# Patient Record
Sex: Female | Born: 1960 | Race: White | Hispanic: No | Marital: Married
Health system: Southern US, Community
[De-identification: ages and names within clinical notes are randomized; demographics above are authoritative.]

---

## 2007-08-03 ENCOUNTER — Emergency Department (HOSPITAL_COMMUNITY): Admission: EM | Admit: 2007-08-03 | Discharge: 2007-08-03 | Payer: Self-pay | Admitting: Emergency Medicine

## 2007-08-03 ENCOUNTER — Inpatient Hospital Stay (HOSPITAL_COMMUNITY): Admission: RE | Admit: 2007-08-03 | Discharge: 2007-08-04 | Payer: Self-pay | Admitting: Psychiatry

## 2007-08-03 ENCOUNTER — Ambulatory Visit: Payer: Self-pay | Admitting: Psychiatry

## 2007-09-09 ENCOUNTER — Other Ambulatory Visit: Admission: RE | Admit: 2007-09-09 | Discharge: 2007-09-09 | Payer: Self-pay | Admitting: Family Medicine

## 2007-09-21 ENCOUNTER — Encounter: Admission: RE | Admit: 2007-09-21 | Discharge: 2007-09-21 | Payer: Self-pay | Admitting: Family Medicine

## 2007-09-23 ENCOUNTER — Ambulatory Visit (HOSPITAL_COMMUNITY): Admission: RE | Admit: 2007-09-23 | Discharge: 2007-09-24 | Payer: Self-pay | Admitting: Neurosurgery

## 2008-09-22 ENCOUNTER — Encounter: Admission: RE | Admit: 2008-09-22 | Discharge: 2008-09-22 | Payer: Self-pay | Admitting: Family Medicine

## 2009-02-07 ENCOUNTER — Emergency Department (HOSPITAL_COMMUNITY): Admission: EM | Admit: 2009-02-07 | Discharge: 2009-02-07 | Payer: Self-pay | Admitting: Emergency Medicine

## 2009-09-26 ENCOUNTER — Encounter: Admission: RE | Admit: 2009-09-26 | Discharge: 2009-09-26 | Payer: Self-pay | Admitting: Family Medicine

## 2010-08-30 LAB — BASIC METABOLIC PANEL
BUN: 7 mg/dL (ref 6–23)
CO2: 27 mEq/L (ref 19–32)
Chloride: 107 mEq/L (ref 96–112)
Potassium: 3.7 mEq/L (ref 3.5–5.1)

## 2010-08-30 LAB — CBC
MCV: 94.3 fL (ref 78.0–100.0)
RBC: 4.27 MIL/uL (ref 3.87–5.11)

## 2010-08-30 LAB — URINALYSIS, ROUTINE W REFLEX MICROSCOPIC
Bilirubin Urine: NEGATIVE
Hgb urine dipstick: NEGATIVE
Ketones, ur: NEGATIVE mg/dL
Nitrite: NEGATIVE
Protein, ur: NEGATIVE mg/dL
Specific Gravity, Urine: 1.005 (ref 1.005–1.030)
Urobilinogen, UA: 0.2 mg/dL (ref 0.0–1.0)

## 2010-08-30 LAB — DIFFERENTIAL
Eosinophils Absolute: 0 10*3/uL (ref 0.0–0.7)
Eosinophils Relative: 0 % (ref 0–5)
Lymphocytes Relative: 12 % (ref 12–46)
Lymphs Abs: 1.3 10*3/uL (ref 0.7–4.0)
Monocytes Absolute: 0.5 10*3/uL (ref 0.1–1.0)
Monocytes Relative: 5 % (ref 3–12)
Neutro Abs: 9 10*3/uL — ABNORMAL HIGH (ref 1.7–7.7)
Neutrophils Relative %: 83 % — ABNORMAL HIGH (ref 43–77)

## 2010-08-30 LAB — POCT PREGNANCY, URINE: Preg Test, Ur: NEGATIVE

## 2010-10-01 ENCOUNTER — Other Ambulatory Visit: Payer: Self-pay | Admitting: Family Medicine

## 2010-10-01 DIAGNOSIS — Z1231 Encounter for screening mammogram for malignant neoplasm of breast: Secondary | ICD-10-CM

## 2010-10-08 NOTE — Op Note (Signed)
NAMEMarland Kitchen  Gina, Fowler            ACCOUNT NO.:  [id number]   MEDICAL RECORD NO.:  [id number]          PATIENT TYPE:  OIB   LOCATION:  3523                         FACILITY:  [facility]   PHYSICIAN:  Danae Orleans. Venetia Maxon, M.D.  DATE OF BIRTH:  [birth year]   DATE OF PROCEDURE:  DATE OF DISCHARGE:                               OPERATIVE REPORT   PREOPERATIVE DIAGNOSES:  1. Cervical disk herniations, C4-C5, C5-C6 with spondylosis.  2. Degenerative disk disease.  3. Radiculopathy.   POSTOPERATIVE DIAGNOSES:  1. Cervical disk herniations, C4-C5, C5-C6 with spondylosis.  2. Degenerative disk disease.  3. Radiculopathy.   PROCEDURE:  Anterior cervical decompression and fusion, C4-C5 and C5-C6  with allograft, autograft, FortrOss, and plate.   SURGEON:  Danae Orleans. Venetia Maxon, MD   ASSISTANTS:  1. Georgiann Cocker, RN  2. Clydene Fake, MD   ANESTHESIA:  General endotracheal.   ESTIMATED BLOOD LOSS:  Minimal at 25 mL.   COMPLICATIONS:  None.   DISPOSITION:  Recovery.   INDICATIONS:  Gina Fowler is a 50 year old woman with significant  cervical spondylosis and disk herniation C4-C5 and C5-C6 on the right  with right arm pain and weakness.  It was elected to take her to surgery  for anterior cervical decompression and fusion at the affected levels.   PROCEDURE:  Gina Fowler was brought to the operating room.  Following  satisfactory and uncomplicated induction of general endotracheal  anesthesia and placement of intravenous lines, the patient was placed in  a supine position on the operating table.  Her neck was placed in slight  extension.  She was placed in 5 pounds of Halter traction.  Her anterior  neck was prepped and draped in the usual sterile fashion using left-  sided transverse skin incision from midline to the anterior border  sternocleidomastoid muscle that was made after infiltrating skin and  subcutaneous tissues and local lidocaine.  Incision was carried through  platysmal  layer.  Subplatysmal dissection was performed exposing the  anterior border sternocleidomastoid muscle using blunt dissection.  The  carotid sheath was kept lateral, trachea and esophagus kept medial  exposing the anterior cervical spine.  Bent spinal needles were placed,  it was felt to be the C4-C5, C5-C6 levels and this was confirmed by the  intraoperative x-ray.  Subsequently, the longus colli muscles were taken  down from the anterior cervical spine from C4-C6 bilaterally using  electrocautery and key elevator.  Self-retaining shadow line retractor  was placed along with up and down retractors.  The interspaces were  incised at each of these levels.  After confirmatory x-ray was obtained,  the endplates were eburnated with high-speed drill and bone autograft  was preserved for later use of bone grafting.  Under loupe magnification  and using the operating microscope, the uncinate spurs were removed and  posterior longitudinal ligament was incised and removed with 2 and 3-mm  gold tip Kerrison rongeurs.  Each of the interspaces were decompressed  and as were the neural foramina, there was a fragment of herniated disk  at C4-C5 on the right and this was removed with significant  decompression of the lateral recess and C5 nerve root.  After hemostasis  was assured with Gelfoam soaked in thrombin and trial sizing a 6-mm  allograft bone wedge was selected, fashioned with a high-speed drill  pack with morcellized bone autograft and FortrOss reconstituted with the  patient's own autogenous blood.  This was countersunk in the interspace  appropriately.  Attention was then turned to the C5-C6 level where the  similar decompression was performed.  The C6 nerve root was widely  decompressed.  Hemostasis and both the cervical spinal dura and both C6  nerve root was widely decompressed.  Hemostasis was again assured and  after trial sizing, a 6-mm bone graft was fashioned with a high-speed  drill  and packed with morcellized bone autograft and FortrOss inserted  in the interspace and countersunk appropriately.  A 32-mm anterior  cervical plate was affixed to the anterior cervical spine using the  Trestle system.  A 14-mm variable angle screws were placed at C4-C5-C6.  The traction weight was removed prior to placing the plate.  All screws  had excellent purchase.  Locking mechanisms were engaged.  Final x-ray  demonstrated well-positioned interbody grafts and anterior cervical  plate.  The hemostasis was assured.  The soft tissues were inspected and  found to be in good repair.  The platysmal layer was closed with 3-0  Vicryl sutures, and the skin edges were reapproximated with 3-0 Vicryl  subcuticular stitch.  The wound was dressed with Dermabond.  The patient  was extubated in the operating room and taken to recovery in stable  satisfactory condition.      Danae Orleans. Venetia Maxon, M.D.  Electronically Signed     JDS/MEDQ  D:  09/23/2007  T:  09/24/2007  Job:  [id number]

## 2010-10-08 NOTE — Discharge Summary (Signed)
NAMEMarland Fowler  CIJI, BOSTON NO.:  [id number]   MEDICAL RECORD NO.:  [id number]          PATIENT TYPE:  IPS   LOCATION:  0307                          [facility]ILITY[facility]  [facility]   PHYSICIAN:  Anselm Jungling, MD  DATE OF BIRTH:  1961-01-10   DATE OF ADMISSION:  08/03/2007  DATE OF DISCHARGE:  08/04/2007                               DISCHARGE SUMMARY   IDENTIFYING DATA AND REASON FOR ADMISSION:  The patient is a 50 year old  married white female who was admitted due to mood instability,  increasing depression.  Please refer to the admission note for further  details pertaining to the symptoms, circumstances and history that led  to her hospitalization.  She was given an initial Axis I diagnosis of  major depressive disorder, recurrent.   MEDICAL AND LABORATORY:  The patient was medically and physically  assessed by the psychiatric nurse practitioner.  She was in good health  without any acute medical problems.  There were no significant medical  issues.   HOSPITAL COURSE:  The patient was admitted to the adult inpatient  psychiatric service.  She presented as a well-nourished, well-developed  adult female who was alert, fully oriented, well-organized, and a good  historian.  There were no signs or symptoms of psychosis or thought  disorder.  Her mood appeared mildly depressed and her affect was  appropriate, with good range.  She reported in the initial interview  that her health insurance company's help line told her to report that  she was having suicidal ideation in order to secure admission to the  inpatient unit, ostensibly for the sake of being seen sooner that she  would have if she had made an appointment for an outpatient psychiatrist  and waited 3 weeks for this.   When I explained a at length to the patient and in detail the specific  indications and criteria for inpatient psychiatric admission, we both  agreed that she was not appropriate for the inpatient  service.   Her case manager contacted her husband, who accepted her explanation of  the circumstances.   There was significant evidence that the patient's mood had deteriorated  because of cessation of Cymbalta therapy.  We recommended a restarting  of Cymbalta and wrote a prescription for this.   AFTERCARE:  The patient was to follow up with Donnie Aho, nurse  practitioner, for psychiatric medication management at [facility].  She was to follow-up with her counselor, Shaaron Adler, also at [facility].   DISCHARGE MEDICATIONS:  Cymbalta 30 mg daily.   DISCHARGE DIAGNOSES:  AXIS I: Major depressive disorder, recurrent.  AXIS II:  Deferred.  AXIS III:  No acute or chronic illnesses.  AXIS IV:  Stressors severe.  AXIS V: GAF on discharge 60.      Anselm Jungling, MD  Electronically Signed     SPB/MEDQ  D:  08/26/2007  T:  08/27/2007  Job:  [id number]

## 2010-10-30 ENCOUNTER — Ambulatory Visit: Payer: Self-pay

## 2010-10-31 ENCOUNTER — Ambulatory Visit
Admission: RE | Admit: 2010-10-31 | Discharge: 2010-10-31 | Disposition: A | Discharge status: Home / Self Care | Payer: BC Managed Care – PPO | Source: Ambulatory Visit | Attending: Family Medicine | Admitting: Family Medicine

## 2010-10-31 DIAGNOSIS — Z1231 Encounter for screening mammogram for malignant neoplasm of breast: Secondary | ICD-10-CM

## 2010-11-05 ENCOUNTER — Other Ambulatory Visit: Payer: Self-pay | Admitting: Family Medicine

## 2010-11-05 DIAGNOSIS — R928 Other abnormal and inconclusive findings on diagnostic imaging of breast: Secondary | ICD-10-CM

## 2010-11-12 ENCOUNTER — Ambulatory Visit
Admission: RE | Admit: 2010-11-12 | Discharge: 2010-11-12 | Disposition: A | Discharge status: Home / Self Care | Payer: BC Managed Care – PPO | Source: Ambulatory Visit | Attending: Family Medicine | Admitting: Family Medicine

## 2010-11-12 DIAGNOSIS — R928 Other abnormal and inconclusive findings on diagnostic imaging of breast: Secondary | ICD-10-CM

## 2011-02-17 LAB — RAPID URINE DRUG SCREEN, HOSP PERFORMED
Amphetamines: NOT DETECTED
Cocaine: NOT DETECTED
Tetrahydrocannabinol: NOT DETECTED

## 2011-02-17 LAB — DIFFERENTIAL
Basophils Absolute: 0
Eosinophils Absolute: 0.1
Eosinophils Relative: 2
Lymphs Abs: 2.1
Monocytes Relative: 6

## 2011-02-17 LAB — ETHANOL: Alcohol, Ethyl (B): <5

## 2011-02-17 LAB — BASIC METABOLIC PANEL
GFR calc Af Amer: >60
Sodium: 141

## 2011-02-17 LAB — CBC
MCHC: 33.9
Platelets: 266

## 2011-02-18 LAB — CBC
HCT: 38.9
Hemoglobin: 13.1
MCHC: 33.8
MCV: 86.8
Platelets: 256
RBC: 4.48
RDW: 14.3
WBC: 8.7

## 2011-04-30 ENCOUNTER — Other Ambulatory Visit: Payer: Self-pay | Admitting: Family Medicine

## 2011-04-30 DIAGNOSIS — N63 Unspecified lump in unspecified breast: Secondary | ICD-10-CM

## 2011-05-14 ENCOUNTER — Ambulatory Visit
Admission: RE | Admit: 2011-05-14 | Discharge: 2011-05-14 | Disposition: A | Discharge status: Home / Self Care | Payer: BC Managed Care – PPO | Source: Ambulatory Visit | Attending: Family Medicine | Admitting: Family Medicine

## 2011-05-14 DIAGNOSIS — N63 Unspecified lump in unspecified breast: Secondary | ICD-10-CM

## 2011-11-19 ENCOUNTER — Other Ambulatory Visit: Payer: Self-pay | Admitting: Family Medicine

## 2011-11-19 DIAGNOSIS — N6009 Solitary cyst of unspecified breast: Secondary | ICD-10-CM

## 2011-11-28 ENCOUNTER — Ambulatory Visit
Admission: RE | Admit: 2011-11-28 | Discharge: 2011-11-28 | Disposition: A | Discharge status: Home / Self Care | Payer: BC Managed Care – PPO | Source: Ambulatory Visit | Attending: Family Medicine | Admitting: Family Medicine

## 2011-11-28 DIAGNOSIS — N6009 Solitary cyst of unspecified breast: Secondary | ICD-10-CM

## 2013-01-10 ENCOUNTER — Other Ambulatory Visit: Payer: Self-pay

## 2013-01-10 DIAGNOSIS — Z1231 Encounter for screening mammogram for malignant neoplasm of breast: Secondary | ICD-10-CM

## 2013-01-31 ENCOUNTER — Ambulatory Visit
Admission: RE | Admit: 2013-01-31 | Discharge: 2013-01-31 | Disposition: A | Discharge status: Home / Self Care | Payer: BC Managed Care – PPO | Source: Ambulatory Visit

## 2013-01-31 DIAGNOSIS — Z1231 Encounter for screening mammogram for malignant neoplasm of breast: Secondary | ICD-10-CM

## 2013-08-18 ENCOUNTER — Ambulatory Visit
Admission: RE | Admit: 2013-08-18 | Discharge: 2013-08-18 | Disposition: A | Discharge status: Home / Self Care | Payer: BC Managed Care – PPO | Source: Ambulatory Visit | Attending: Physician Assistant | Admitting: Physician Assistant

## 2013-08-18 ENCOUNTER — Other Ambulatory Visit: Payer: Self-pay | Admitting: Physician Assistant

## 2013-08-18 DIAGNOSIS — R1032 Left lower quadrant pain: Secondary | ICD-10-CM

## 2013-08-18 MED ORDER — IOHEXOL 300 MG/ML  SOLN
100.0000 mL | Freq: Once | INTRAMUSCULAR | Status: AC | PRN
  Administered 2013-08-18: 100 mL via INTRAVENOUS

## 2014-03-16 ENCOUNTER — Other Ambulatory Visit: Payer: Self-pay

## 2014-03-16 DIAGNOSIS — Z1231 Encounter for screening mammogram for malignant neoplasm of breast: Secondary | ICD-10-CM

## 2014-03-29 ENCOUNTER — Ambulatory Visit
Admission: RE | Admit: 2014-03-29 | Discharge: 2014-03-29 | Disposition: A | Discharge status: Home / Self Care | Payer: BC Managed Care – PPO | Source: Ambulatory Visit

## 2014-03-29 DIAGNOSIS — Z1231 Encounter for screening mammogram for malignant neoplasm of breast: Secondary | ICD-10-CM

## 2016-12-12 ENCOUNTER — Other Ambulatory Visit: Payer: Self-pay | Admitting: Family Medicine

## 2016-12-12 ENCOUNTER — Ambulatory Visit
Admission: RE | Admit: 2016-12-12 | Discharge: 2016-12-12 | Disposition: A | Discharge status: Home / Self Care | Payer: Self-pay | Source: Ambulatory Visit | Attending: Family Medicine | Admitting: Family Medicine

## 2016-12-12 DIAGNOSIS — M5441 Lumbago with sciatica, right side: Secondary | ICD-10-CM

## 2018-02-07 IMAGING — CR DG SACRUM/COCCYX 2+V
3 series · 3 of 3 positions shown · non-contrast
Comparison: CT 08/18/2013 .

CLINICAL DATA: Low back pain.

EXAM:
SACRUM AND COCCYX - 2+ VIEW

[t sacrum a.p.]
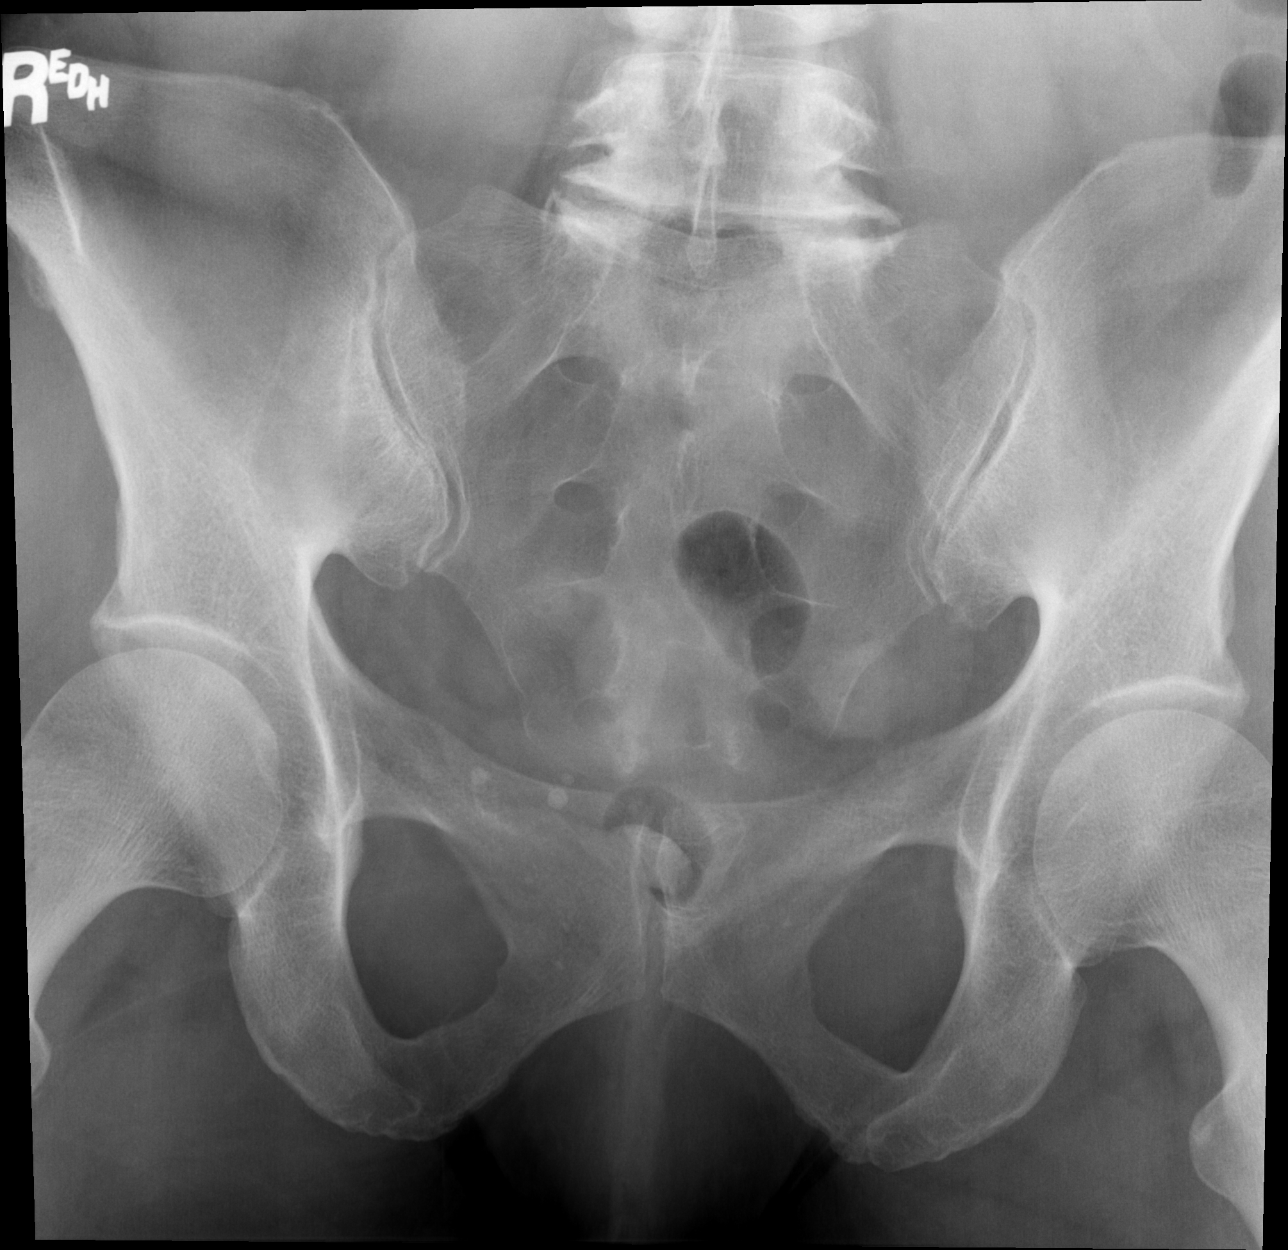

[t coccyx a.p.]
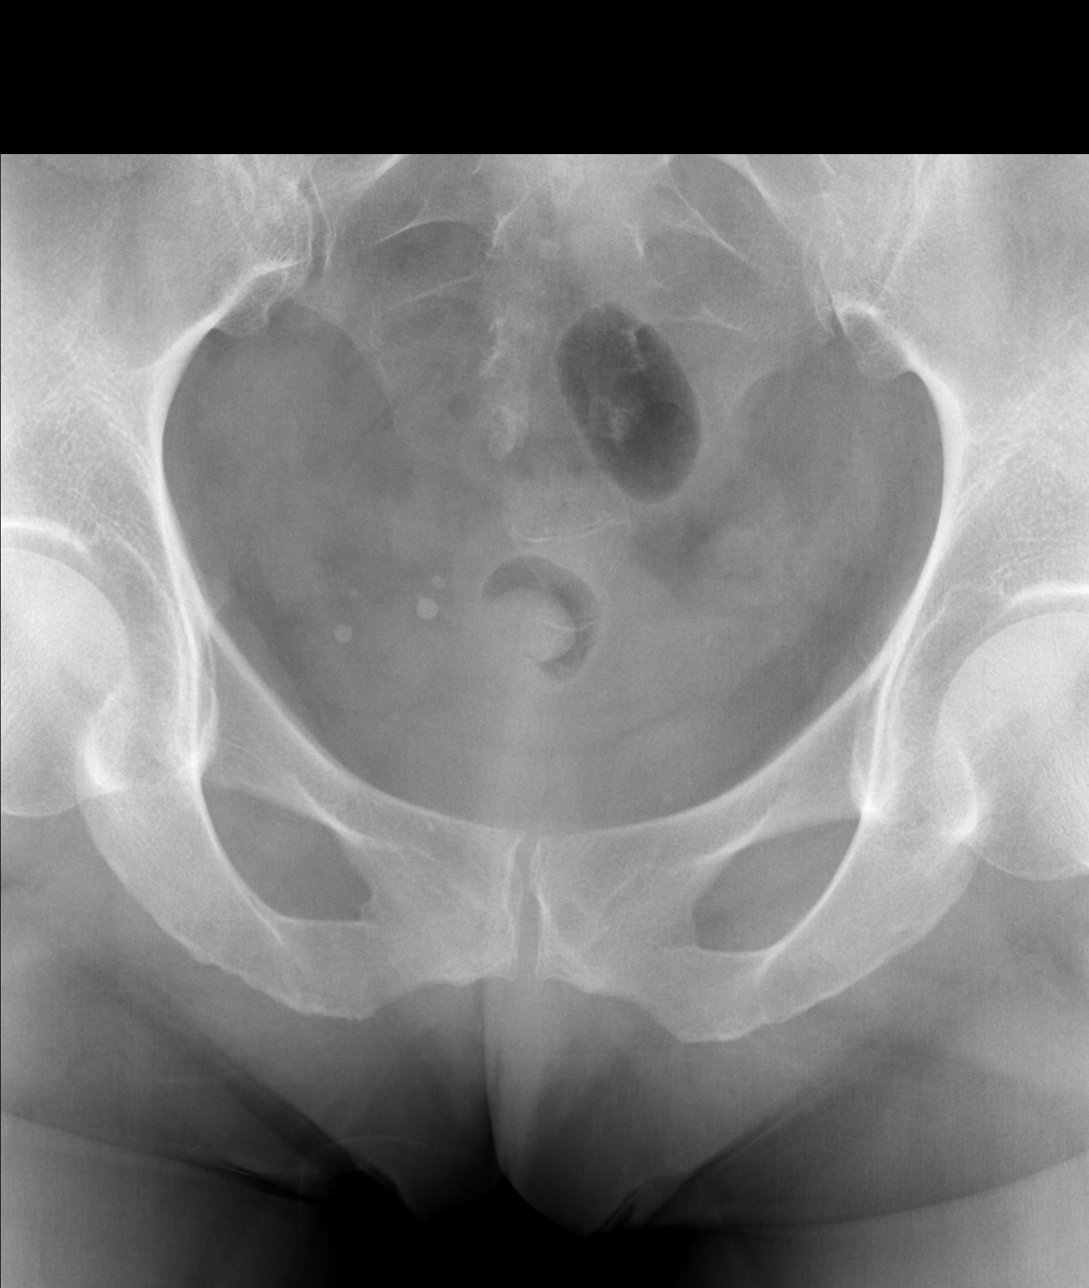

[t sacrum lat]
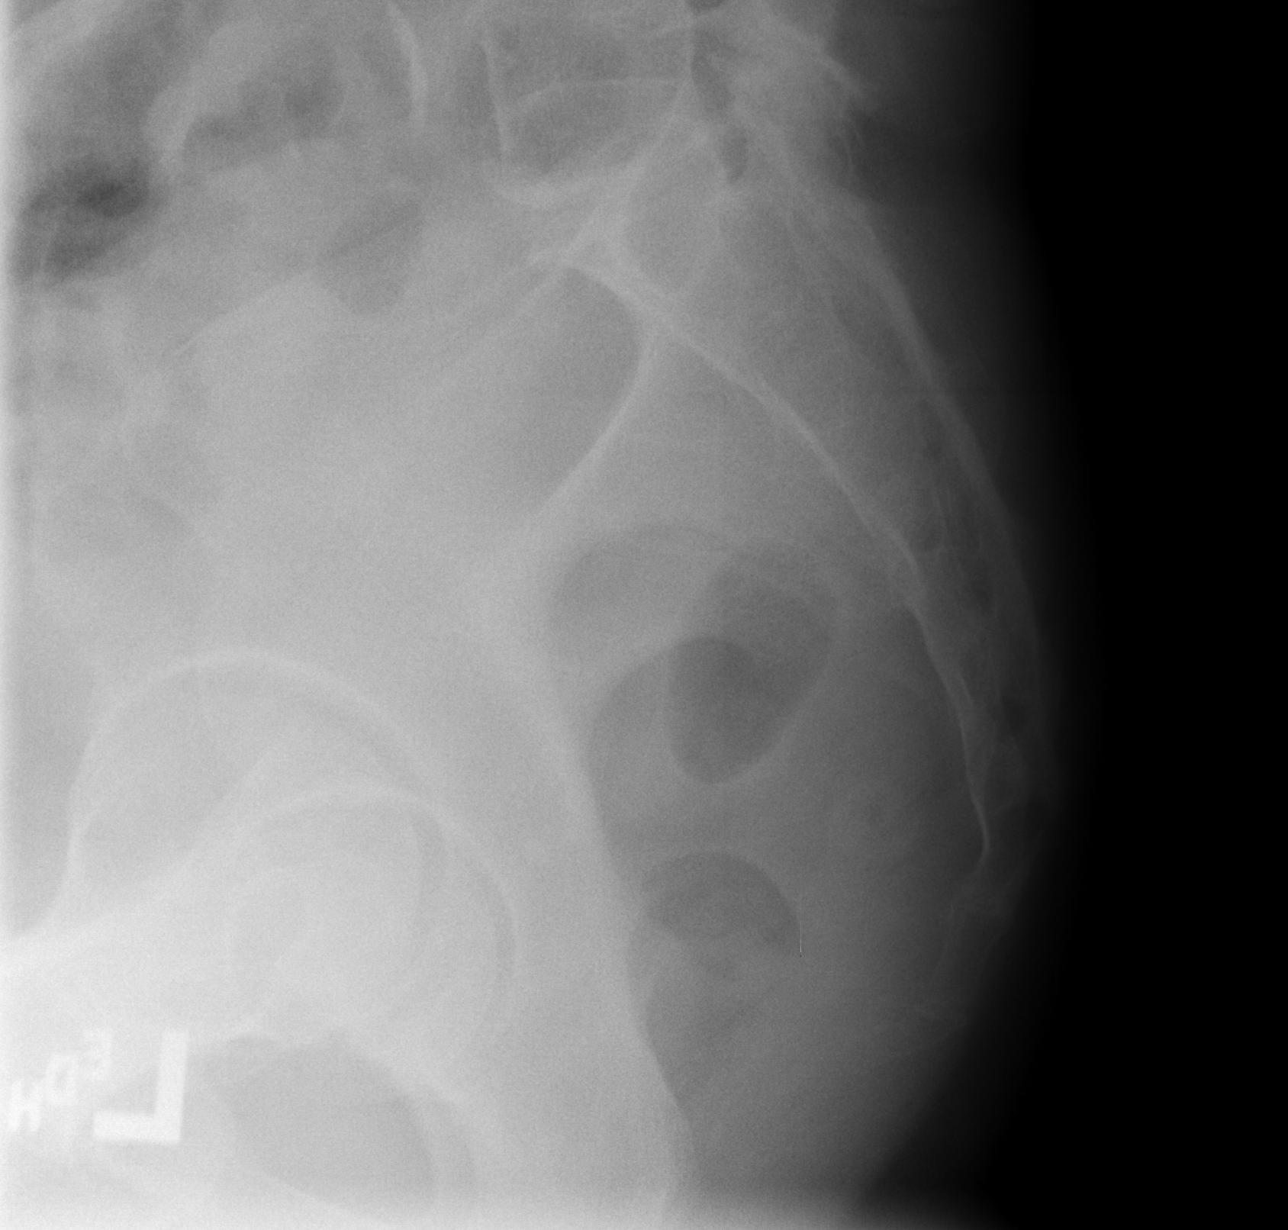

[3 of 3 positions shown; findings below may reference images not displayed]

FINDINGS: Degenerative changes lumbar spine and both hips. No acute bony
abnormality identified. No evidence of fracture or dislocation.
Pelvic calcifications noted consistent with phleboliths..
IMPRESSION: Degenerative changes lumbar spine and both hips. No acute bony
abnormality.

## 2019-01-17 ENCOUNTER — Other Ambulatory Visit: Payer: Self-pay

## 2019-01-17 DIAGNOSIS — Z20822 Contact with and (suspected) exposure to covid-19: Secondary | ICD-10-CM

## 2019-01-18 LAB — NOVEL CORONAVIRUS, NAA: SARS-CoV-2, NAA: NOT DETECTED
